# Patient Record
Sex: Male | Born: 1990 | Race: White | Hispanic: No | Marital: Single | Smoking: Never smoker
Health system: Southern US, Community
[De-identification: ages and names within clinical notes are randomized; demographics above are authoritative.]

## PROBLEM LIST (undated history)

## (undated) DIAGNOSIS — F429 Obsessive-compulsive disorder, unspecified: Secondary | ICD-10-CM

## (undated) HISTORY — DX: Obsessive-compulsive disorder, unspecified: F42.9

## (undated) HISTORY — PX: ANKLE SURGERY: SHX546

## (undated) HISTORY — PX: OTHER SURGICAL HISTORY: SHX169

---
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
## 2012-04-21 ENCOUNTER — Telehealth (HOSPITAL_COMMUNITY): Payer: Self-pay

## 2012-04-21 NOTE — Telephone Encounter (Signed)
Ok

## 2012-05-17 ENCOUNTER — Encounter (HOSPITAL_COMMUNITY): Payer: Self-pay | Admitting: Psychiatry

## 2012-05-17 ENCOUNTER — Ambulatory Visit (INDEPENDENT_AMBULATORY_CARE_PROVIDER_SITE_OTHER): Payer: 59 | Admitting: Psychiatry

## 2012-05-17 VITALS — BP 100/62 | Ht 69.5 in | Wt 150.0 lb

## 2012-05-17 DIAGNOSIS — F429 Obsessive-compulsive disorder, unspecified: Secondary | ICD-10-CM

## 2012-05-17 NOTE — Progress Notes (Signed)
   [facility] Follow-up Outpatient Visit  Derrick Humphrey 1991/01/26   Subjective: The patient is an 21 year old male who has been followed by [facility] since October of 2009. He is currently diagnosed with OCD. He has been stable on Zoloft 200 mg daily for a number of years. Father at called on 04/21/2012, stating that the patient wanted to discontinue his Zoloft. He had met with his primary care about it, but wanted me to be part of the decision. Patient presents today off medications x1 month. He has switched colleges to [employer] in [place]. He is making all A's and B's. He is currently training for soccer and has been invited to 2 professional  trials in [place]. The patient now has a new girlfriend. He feels good off his medication. He felt that it was decreasing his energy. He hasn't had no cutting episodes. He is sleeping and eating well. He would like to continue without medication. His OCD is not acting up. Filed Vitals:   05/17/12 1248  BP: 100/62    Mental Status Examination  Appearance: Casual Alert: Yes Attention: good  Cooperative: Yes Eye Contact: Good Speech: Regular rate rhythm and volume Psychomotor Activity: Normal Memory/Concentration: Intact Oriented: person, place, time/date and situation Mood: Euthymic Affect: Full Range Thought Processes and Associations: Logical Fund of Knowledge: Fair Thought Content: No suicidal or homicidal thoughts Insight: Fair Judgement: Fair  Diagnosis: OCD  Treatment Plan: We will continue with no medication. Patient may call and reschedule if necessary. Otherwise I will see him back as needed.  Jamse Mead, MD

## 2012-07-19 ENCOUNTER — Telehealth (HOSPITAL_COMMUNITY): Payer: Self-pay

## 2012-07-19 NOTE — Telephone Encounter (Signed)
Pt has gone off meds and is having problems at school. Dad would like to speak with you

## 2012-07-19 NOTE — Telephone Encounter (Signed)
Not currently treating patient since appointment in June. He is over 21. Patient will have to make an appointment.

## 2012-07-28 ENCOUNTER — Other Ambulatory Visit (HOSPITAL_COMMUNITY): Payer: Self-pay

## 2012-07-28 MED ORDER — SERTRALINE HCL 100 MG PO TABS: ORAL_TABLET | ORAL | Status: DC

## 2012-07-28 NOTE — Telephone Encounter (Signed)
Completed.

## 2012-08-03 ENCOUNTER — Telehealth (HOSPITAL_COMMUNITY): Payer: Self-pay

## 2012-08-03 MED ORDER — FLUOXETINE HCL 20 MG PO TABS: 20.0000 mg | ORAL_TABLET | Freq: Every day | ORAL | Status: DC

## 2012-08-03 NOTE — Telephone Encounter (Signed)
She has had sexual side effects with Zoloft. Currently in a relationship. We'll discontinue the Zoloft. We'll start Prozac. Patient check in with any issues. Keep appointment in November.

## 2012-08-03 NOTE — Telephone Encounter (Signed)
Has questions about medication

## 2012-09-01 ENCOUNTER — Other Ambulatory Visit (HOSPITAL_COMMUNITY): Payer: Self-pay | Admitting: Psychiatry

## 2012-09-01 ENCOUNTER — Telehealth (HOSPITAL_COMMUNITY): Payer: Self-pay

## 2012-09-01 NOTE — Telephone Encounter (Signed)
Patient with sexual side effects from Prozac. Wants to go back on Zoloft. Has quantity sufficient. Primary care will give the patient herbal supplement which can come bad side effects. We'll restart Zoloft. Patient at one half pill daily for 2 weeks then increase to one full pill. Patient has appointment in November.

## 2012-10-22 ENCOUNTER — Other Ambulatory Visit (HOSPITAL_COMMUNITY): Payer: Self-pay | Admitting: Psychiatry

## 2012-10-22 ENCOUNTER — Ambulatory Visit (HOSPITAL_COMMUNITY): Payer: Self-pay | Admitting: Psychiatry

## 2017-04-27 ENCOUNTER — Emergency Department (HOSPITAL_BASED_OUTPATIENT_CLINIC_OR_DEPARTMENT_OTHER)
Admission: EM | Admit: 2017-04-27 | Discharge: 2017-04-27 | Disposition: A | Discharge status: Home / Self Care | Payer: 59 | Attending: Emergency Medicine | Admitting: Emergency Medicine

## 2017-04-27 ENCOUNTER — Emergency Department (HOSPITAL_BASED_OUTPATIENT_CLINIC_OR_DEPARTMENT_OTHER): Payer: 59

## 2017-04-27 ENCOUNTER — Encounter (HOSPITAL_BASED_OUTPATIENT_CLINIC_OR_DEPARTMENT_OTHER): Payer: Self-pay | Admitting: *Deleted

## 2017-04-27 DIAGNOSIS — S99912A Unspecified injury of left ankle, initial encounter: Secondary | ICD-10-CM | POA: Diagnosis present

## 2017-04-27 DIAGNOSIS — Y9366 Activity, soccer: Secondary | ICD-10-CM | POA: Insufficient documentation

## 2017-04-27 DIAGNOSIS — Y999 Unspecified external cause status: Secondary | ICD-10-CM | POA: Diagnosis not present

## 2017-04-27 DIAGNOSIS — Y929 Unspecified place or not applicable: Secondary | ICD-10-CM | POA: Insufficient documentation

## 2017-04-27 DIAGNOSIS — X501XXA Overexertion from prolonged static or awkward postures, initial encounter: Secondary | ICD-10-CM | POA: Diagnosis not present

## 2017-04-27 DIAGNOSIS — S93402A Sprain of unspecified ligament of left ankle, initial encounter: Secondary | ICD-10-CM | POA: Diagnosis not present

## 2017-04-27 NOTE — ED Notes (Signed)
ED Provider at bedside. 

## 2017-04-27 NOTE — ED Provider Notes (Signed)
[facility] Provider Note   CSN: [id number] Arrival date & time: 04/27/17  1735  By signing my name below, I, Derrick Humphrey, attest that this documentation has been prepared under the direction and in the presence of physician practitioner, Derrick Humphrey, Tareq Dwan, MD. Electronically Signed: Linna Darnerussell Humphrey, Scribe. 04/27/2017. 6:01 PM.  History   Chief Complaint Chief Complaint  Patient presents with  . Ankle Injury   The history is provided by the patient. No language interpreter was used.    HPI Comments: Derrick Humphrey J Siegenthaler is a 26 y.o. male who presents to the Emergency Department for evaluation of a left ankle injury sustained about an hour and a half ago. He states he was playing soccer and inadvertently stepped in a divot on the field. Patient subsequently inverted his left ankle and experienced pain immediately thereafter. He notes he felt and heard popping sensations from his left ankle when the injury was sustained. He reports some associated swelling. No medications or treatments tried PTA. Patient reports significant pain exacerbation with weight bearing. He has a h/o left ankle sprains without any fractures. He denies numbness/tingling, open wounds, or any other associated symptoms.  Past Medical History:  Diagnosis Date  . Obsessive-compulsive disorder     Patient Active Problem List   Diagnosis Date Noted  . OCD (obsessive compulsive disorder) 05/17/2012    Past Surgical History:  Procedure Laterality Date  . wisdom teeth         Home Medications    Prior to Admission medications   Not on File    Family History Family History  Problem Relation Age of Onset  . OCD Father   . OCD Maternal Grandmother     Social History Social History  Substance Use Topics  . Smoking status: Never Smoker  . Smokeless tobacco: Never Used  . Alcohol use No     Allergies   Patient has no known allergies.   Review of Systems Review of Systems  Constitutional:  Negative for fever.  Gastrointestinal: Negative for nausea and vomiting.  Musculoskeletal: Positive for arthralgias and joint swelling. Negative for back pain and neck pain.  Skin: Negative for wound.  Neurological: Negative for weakness, numbness and headaches.   Physical Exam Updated Vital Signs BP 125/78 (BP Location: Right Arm)   Pulse 86   Resp 16   Ht 5\' 10"  (1.778 m)   Wt 72.6 kg (160 lb)   SpO2 100%   BMI 22.96 kg/m   Physical Exam  Constitutional: He is oriented to person, place, and time. He appears well-developed and well-nourished.  HENT:  Head: Normocephalic and atraumatic.  Neck: Normal range of motion. Neck supple.  Cardiovascular: Normal rate.   Pulmonary/Chest: Effort normal.  Musculoskeletal: He exhibits tenderness.       Left ankle: He exhibits swelling.  Left ankle: Moderate swelling over lateral malleolus with tenderness to this area. Tenderness to the 5th metatarsal. No pain to the proximal fibula. Neurovascularly intact. No wounds.  Neurological: He is alert and oriented to person, place, and time.  Skin: Skin is warm and dry.  Psychiatric: He has a normal mood and affect.   ED Treatments / Results  Labs (all labs ordered are listed, but only abnormal results are displayed) Labs Reviewed - No data to display  EKG  EKG Interpretation None       Radiology Dg Ankle Complete Left  Result Date: 04/27/2017 CLINICAL DATA:  Left ankle pain and swelling after soccer injury today. EXAM: LEFT ANKLE  COMPLETE - 3+ VIEW COMPARISON:  None. FINDINGS: There is no evidence of fracture, dislocation, or joint effusion. There is no evidence of arthropathy or other focal bone abnormality. Soft tissue swelling is seen over lateral malleolus. IMPRESSION: No fracture or dislocation is noted. Soft tissue swelling is seen over lateral malleolus suggesting ligamentous injury. Electronically Signed   By: Lupita Raider, M.D.   On: 04/27/2017 18:04   Dg Foot Complete  Left  Result Date: 04/27/2017 CLINICAL DATA:  26 year old male with left ankle twisting and pain to the fifth metatarsal. EXAM: LEFT FOOT - COMPLETE 3+ VIEW COMPARISON:  Left ankle radiograph dated 04/27/2017 FINDINGS: There is no acute fracture or dislocation of the foot. The bones are well mineralized. No arthritic changes. There is soft tissue swelling over the lateral malleolus. IMPRESSION: No acute fracture or dislocation. Electronically Signed   By: Elgie Collard M.D.   On: 04/27/2017 18:16    Procedures Procedures (including critical care time)  DIAGNOSTIC STUDIES: Oxygen Saturation is 100% on RA, normal by my interpretation.    COORDINATION OF CARE: 5:59 PM Discussed treatment plan with pt at bedside and pt agreed to plan.  Medications Ordered in ED Medications - No data to display   Initial Impression / Assessment and Plan / ED Course  I have reviewed the triage vital signs and the nursing notes.  Pertinent labs & imaging results that were available during my care of the patient were reviewed by me and considered in my medical decision making (see chart for details).     There is no evidence of fracture. Patient is neurologically intact. He was placed in an ASO ankle splint. He was given crutches. He was advised in ice and elevation. He was advised to use ibuprofen or Tylenol for symptomatically. He is followed by [facility]. He was encouraged to follow-up with them if his symptoms are not improving.  Final Clinical Impressions(s) / ED Diagnoses   Final diagnoses:  Sprain of left ankle, unspecified ligament, initial encounter    New Prescriptions New Prescriptions   No medications on file   I personally performed the services described in this documentation, which was scribed in my presence.  The recorded information has been reviewed and considered.    Derrick Bucco, MD 04/27/17 [phone]

## 2017-04-27 NOTE — ED Notes (Signed)
Patient transported to X-ray 

## 2017-04-27 NOTE — ED Triage Notes (Signed)
Pt c/o left ankle injury while playing soccer x 1 hr ago

## 2017-11-15 ENCOUNTER — Other Ambulatory Visit: Payer: Self-pay

## 2017-11-15 ENCOUNTER — Encounter (HOSPITAL_BASED_OUTPATIENT_CLINIC_OR_DEPARTMENT_OTHER): Payer: Self-pay | Admitting: *Deleted

## 2017-11-15 ENCOUNTER — Emergency Department (HOSPITAL_BASED_OUTPATIENT_CLINIC_OR_DEPARTMENT_OTHER)
Admission: EM | Admit: 2017-11-15 | Discharge: 2017-11-16 | Disposition: A | Discharge status: Home / Self Care | Payer: 59 | Attending: Emergency Medicine | Admitting: Emergency Medicine

## 2017-11-15 DIAGNOSIS — F410 Panic disorder [episodic paroxysmal anxiety] without agoraphobia: Secondary | ICD-10-CM | POA: Diagnosis not present

## 2017-11-15 DIAGNOSIS — F419 Anxiety disorder, unspecified: Secondary | ICD-10-CM | POA: Diagnosis not present

## 2017-11-15 NOTE — ED Triage Notes (Signed)
Pt reports history of OCD. States he has been having panic attacks over the last couple weeks. States they have been worsening. Denies SI/HI

## 2017-11-16 MED ORDER — LORAZEPAM 1 MG PO TABS
1.0000 mg | ORAL_TABLET | Freq: Once | ORAL | Status: AC
  Administered 2017-11-16: 1 mg via ORAL
  Filled 2017-11-16: qty 1

## 2017-11-16 NOTE — ED Notes (Signed)
D/c home with family. Denies SI/HI. States he feels better thatn when he came in and will follow up with PCP

## 2017-11-16 NOTE — ED Provider Notes (Signed)
[facility] EMERGENCY DEPARTMENT Provider Note   CSN: [id number] Arrival date & time: 11/15/17  2347     History   Chief Complaint Chief Complaint  Patient presents with  . Panic Attack    HPI Derrick Humphrey is a 26 y.o. male.  HPI  This is a 26 year old male with history of OCD who presents with anxiety.  Patient reports multiple recent episodes of increasing anxiety.  He reports increased stressors at work.  He states that he feels foggy and his symptoms of anxiety escalate and then wane and can re-escalate.  Also reports some pressure between his eyes.  Denies focal deficits.  Denies vision changes.  Tonight he had an episode that was prolonged which is abnormal for him.  He denies any chest pain, shortness of breath, abdominal pain, nausea, vomiting.  Currently he is asymptomatic and states he feels better.  Previously has been on an SSRI for mood stabilization.  He does have a psychiatrist.  He denies any SI or HI.  Past Medical History:  Diagnosis Date  . Obsessive-compulsive disorder     Patient Active Problem List   Diagnosis Date Noted  . OCD (obsessive compulsive disorder) 05/17/2012    Past Surgical History:  Procedure Laterality Date  . ANKLE SURGERY    . wisdom teeth         Home Medications    Prior to Admission medications   Not on File    Family History Family History  Problem Relation Age of Onset  . OCD Father   . OCD Maternal Grandmother     Social History Social History   Tobacco Use  . Smoking status: Never Smoker  . Smokeless tobacco: Never Used  Substance Use Topics  . Alcohol use: Yes    Comment: occ  . Drug use: No     Allergies   Patient has no known allergies.   Review of Systems Review of Systems  Respiratory: Negative for shortness of breath.   Cardiovascular: Negative for chest pain and palpitations.  Gastrointestinal: Negative for abdominal pain, nausea and vomiting.  Psychiatric/Behavioral:  Negative for confusion and suicidal ideas. The patient is nervous/anxious.   All other systems reviewed and are negative.    Physical Exam Updated Vital Signs BP (!) 139/92   Pulse 98   Temp 98.6 F (37 C) (Oral)   Resp 18   Ht 5\' 10"  (1.778 m)   Wt 70.3 kg (155 lb)   SpO2 100%   BMI 22.24 kg/m   Physical Exam  Constitutional: He is oriented to person, place, and time. He appears well-developed and well-nourished.  HENT:  Head: Normocephalic and atraumatic.  Eyes: Pupils are equal, round, and reactive to light.  Cardiovascular: Normal rate, regular rhythm and normal heart sounds.  No murmur heard. Pulmonary/Chest: Effort normal and breath sounds normal. No respiratory distress. He has no wheezes.  Abdominal: Soft. There is no tenderness. There is no rebound.  Lymphadenopathy:    He has no cervical adenopathy.  Neurological: He is alert and oriented to person, place, and time.  Cranial nerves II through XII intact, 5 out of 5 strength in all 4 extremities, no dysmetria to finger-nose-finger  Skin: Skin is warm and dry.  Psychiatric: He has a normal mood and affect.  Nursing note and vitals reviewed.    ED Treatments / Results  Labs (all labs ordered are listed, but only abnormal results are displayed) Labs Reviewed - No data to display  EKG  EKG Interpretation None       Radiology No results found.  Procedures Procedures (including critical care time)  Medications Ordered in ED Medications  LORazepam (ATIVAN) tablet 1 mg (not administered)     Initial Impression / Assessment and Plan / ED Course  I have reviewed the triage vital signs and the nursing notes.  Pertinent labs & imaging results that were available during my care of the patient were reviewed by me and considered in my medical decision making (see chart for details).     She presents with self-described increasing episodes of anxiety.  He is nontoxic appearing and currently asymptomatic.   Denies any significant physical symptoms and his exam is normal.  Patient was given 1 dose of Ativan.  Recommend close follow-up with his psychiatrist.  After history, exam, and medical workup I feel the patient has been appropriately medically screened and is safe for discharge home. Pertinent diagnoses were discussed with the patient. Patient was given return precautions.   Final Clinical Impressions(s) / ED Diagnoses   Final diagnoses:  Anxiety attack    ED Discharge Orders    None       Shon BatonHorton, Dewayne Severe F, MD 11/16/17 [phone]
